# Patient Record
Sex: Female | Born: 1990 | Hispanic: Yes | Marital: Married | Smoking: Never smoker
Health system: Southern US, Community
[De-identification: ages and names within clinical notes are randomized; demographics above are authoritative.]

## PROBLEM LIST (undated history)

## (undated) DIAGNOSIS — R569 Unspecified convulsions: Secondary | ICD-10-CM

---

## 2015-10-24 ENCOUNTER — Encounter: Payer: Self-pay | Admitting: Emergency Medicine

## 2015-10-24 ENCOUNTER — Emergency Department: Payer: Self-pay

## 2015-10-24 ENCOUNTER — Observation Stay
Admission: EM | Admit: 2015-10-24 | Discharge: 2015-10-26 | Disposition: A | Discharge status: Home / Self Care | Payer: Self-pay | Attending: General Surgery | Admitting: General Surgery

## 2015-10-24 DIAGNOSIS — R1011 Right upper quadrant pain: Principal | ICD-10-CM | POA: Insufficient documentation

## 2015-10-24 DIAGNOSIS — R509 Fever, unspecified: Secondary | ICD-10-CM | POA: Insufficient documentation

## 2015-10-24 DIAGNOSIS — R52 Pain, unspecified: Secondary | ICD-10-CM | POA: Insufficient documentation

## 2015-10-24 DIAGNOSIS — R569 Unspecified convulsions: Secondary | ICD-10-CM | POA: Insufficient documentation

## 2015-10-24 DIAGNOSIS — Z791 Long term (current) use of non-steroidal anti-inflammatories (NSAID): Secondary | ICD-10-CM | POA: Insufficient documentation

## 2015-10-24 DIAGNOSIS — D1803 Hemangioma of intra-abdominal structures: Secondary | ICD-10-CM | POA: Insufficient documentation

## 2015-10-24 DIAGNOSIS — E876 Hypokalemia: Secondary | ICD-10-CM | POA: Insufficient documentation

## 2015-10-24 DIAGNOSIS — R112 Nausea with vomiting, unspecified: Secondary | ICD-10-CM | POA: Insufficient documentation

## 2015-10-24 DIAGNOSIS — R109 Unspecified abdominal pain: Secondary | ICD-10-CM | POA: Diagnosis present

## 2015-10-24 DIAGNOSIS — R1010 Upper abdominal pain, unspecified: Secondary | ICD-10-CM | POA: Insufficient documentation

## 2015-10-24 DIAGNOSIS — R51 Headache: Secondary | ICD-10-CM | POA: Insufficient documentation

## 2015-10-24 DIAGNOSIS — E871 Hypo-osmolality and hyponatremia: Secondary | ICD-10-CM | POA: Insufficient documentation

## 2015-10-24 HISTORY — DX: Unspecified convulsions: R56.9

## 2015-10-24 LAB — CBC
HCT: 32.2 % — ABNORMAL LOW (ref 35.0–47.0)
Hemoglobin: 10.9 g/dL — ABNORMAL LOW (ref 12.0–16.0)
MCH: 27.4 pg (ref 26.0–34.0)
MCHC: 33.7 g/dL (ref 32.0–36.0)
MCV: 81.4 fL (ref 80.0–100.0)
PLATELETS: 242 10*3/uL (ref 150–440)
RBC: 3.96 MIL/uL (ref 3.80–5.20)
RDW: 14.5 % (ref 11.5–14.5)
WBC: 14.5 10*3/uL — AB (ref 3.6–11.0)

## 2015-10-24 LAB — URINALYSIS COMPLETE WITH MICROSCOPIC (ARMC ONLY)
BILIRUBIN URINE: NEGATIVE
Bacteria, UA: NONE SEEN
GLUCOSE, UA: NEGATIVE mg/dL
Ketones, ur: NEGATIVE mg/dL
Nitrite: NEGATIVE
Protein, ur: 100 mg/dL — AB
SPECIFIC GRAVITY, URINE: 1.016 (ref 1.005–1.030)
pH: 5 (ref 5.0–8.0)

## 2015-10-24 LAB — COMPREHENSIVE METABOLIC PANEL
ALT: 26 U/L (ref 14–54)
AST: 26 U/L (ref 15–41)
Albumin: 2.7 g/dL — ABNORMAL LOW (ref 3.5–5.0)
Alkaline Phosphatase: 154 U/L — ABNORMAL HIGH (ref 38–126)
Anion gap: 7 (ref 5–15)
BUN: 13 mg/dL (ref 6–20)
CO2: 24 mmol/L (ref 22–32)
Calcium: 8.4 mg/dL — ABNORMAL LOW (ref 8.9–10.3)
Chloride: 107 mmol/L (ref 101–111)
Creatinine, Ser: 0.78 mg/dL (ref 0.44–1.00)
GFR calc Af Amer: >60 mL/min (ref >60)
GFR calc non Af Amer: >60 mL/min (ref >60)
Glucose, Bld: 94 mg/dL (ref 65–99)
Potassium: 3.1 mmol/L — ABNORMAL LOW (ref 3.5–5.1)
Sodium: 138 mmol/L (ref 135–145)
Total Bilirubin: 2.1 mg/dL — ABNORMAL HIGH (ref 0.3–1.2)
Total Protein: 6.6 g/dL (ref 6.5–8.1)

## 2015-10-24 LAB — LIPASE, BLOOD: Lipase: 21 U/L (ref 11–51)

## 2015-10-24 LAB — PREGNANCY, URINE: Preg Test, Ur: NEGATIVE

## 2015-10-24 MED ORDER — ONDANSETRON 8 MG PO TBDP: 4.0000 mg | ORAL_TABLET | Freq: Four times a day (QID) | ORAL | Status: DC | PRN

## 2015-10-24 MED ORDER — GI COCKTAIL ~~LOC~~
30.0000 mL | ORAL | Status: AC
  Administered 2015-10-24: 30 mL via ORAL
  Filled 2015-10-24: qty 30

## 2015-10-24 MED ORDER — PIPERACILLIN-TAZOBACTAM 3.375 G IVPB
3.3750 g | Freq: Three times a day (TID) | INTRAVENOUS | Status: DC
  Administered 2015-10-24 – 2015-10-26 (×6): 3.375 g via INTRAVENOUS
  Filled 2015-10-24 (×10): qty 50

## 2015-10-24 MED ORDER — KCL IN DEXTROSE-NACL 30-5-0.45 MEQ/L-%-% IV SOLN
INTRAVENOUS | Status: DC
  Administered 2015-10-24 – 2015-10-26 (×6): via INTRAVENOUS
  Filled 2015-10-24 (×10): qty 1000

## 2015-10-24 MED ORDER — LORAZEPAM 2 MG/ML IJ SOLN: 2.0000 mg | INTRAMUSCULAR | Status: DC | PRN

## 2015-10-24 MED ORDER — KETOROLAC TROMETHAMINE 15 MG/ML IJ SOLN
15.0000 mg | Freq: Four times a day (QID) | INTRAMUSCULAR | Status: DC | PRN
  Administered 2015-10-24: 15 mg via INTRAVENOUS
  Filled 2015-10-24: qty 1

## 2015-10-24 MED ORDER — ACETAMINOPHEN 325 MG PO TABS
650.0000 mg | ORAL_TABLET | Freq: Four times a day (QID) | ORAL | Status: DC | PRN
  Administered 2015-10-26: 650 mg via ORAL
  Filled 2015-10-24 (×2): qty 2

## 2015-10-24 MED ORDER — INFLUENZA VAC SPLIT QUAD 0.5 ML IM SUSY: 0.5000 mL | PREFILLED_SYRINGE | INTRAMUSCULAR | Status: DC

## 2015-10-24 MED ORDER — FAMOTIDINE 20 MG PO TABS
40.0000 mg | ORAL_TABLET | Freq: Once | ORAL | Status: AC
  Administered 2015-10-24: 40 mg via ORAL
  Filled 2015-10-24: qty 2

## 2015-10-24 MED ORDER — MORPHINE SULFATE (PF) 4 MG/ML IV SOLN
4.0000 mg | INTRAVENOUS | Status: DC | PRN
Start: 2015-10-24 — End: 2015-10-26

## 2015-10-24 MED ORDER — KETOROLAC TROMETHAMINE 15 MG/ML IJ SOLN: 15.0000 mg | Freq: Four times a day (QID) | INTRAMUSCULAR | Status: DC | PRN

## 2015-10-24 MED ORDER — DIPHENHYDRAMINE HCL 25 MG PO CAPS: 25.0000 mg | ORAL_CAPSULE | Freq: Four times a day (QID) | ORAL | Status: DC | PRN

## 2015-10-24 MED ORDER — DIPHENHYDRAMINE HCL 50 MG/ML IJ SOLN: 25.0000 mg | Freq: Four times a day (QID) | INTRAMUSCULAR | Status: DC | PRN

## 2015-10-24 MED ORDER — ONDANSETRON 8 MG PO TBDP
8.0000 mg | ORAL_TABLET | Freq: Once | ORAL | Status: AC
  Administered 2015-10-24: 8 mg via ORAL
  Filled 2015-10-24: qty 1

## 2015-10-24 MED ORDER — HYDRALAZINE HCL 20 MG/ML IJ SOLN: 10.0000 mg | INTRAMUSCULAR | Status: DC | PRN

## 2015-10-24 MED ORDER — PIPERACILLIN-TAZOBACTAM 3.375 G IVPB
3.3750 g | Freq: Once | INTRAVENOUS | Status: AC
  Administered 2015-10-24: 3.375 g via INTRAVENOUS
  Filled 2015-10-24: qty 50

## 2015-10-24 MED ORDER — ONDANSETRON HCL 4 MG/2ML IJ SOLN: 4.0000 mg | Freq: Four times a day (QID) | INTRAMUSCULAR | Status: DC | PRN

## 2015-10-24 MED ORDER — PANTOPRAZOLE SODIUM 40 MG IV SOLR
40.0000 mg | Freq: Every day | INTRAVENOUS | Status: DC
Start: 2015-10-24 — End: 2015-10-26
  Administered 2015-10-24 – 2015-10-25 (×2): 40 mg via INTRAVENOUS
  Filled 2015-10-24 (×2): qty 40

## 2015-10-24 NOTE — ED Notes (Addendum)
Interpreter present; pt c/o upper abd pain, fever and bone aches; intermittent vomiting; headache; pt has been feeling bad for a week; taking advil with no relief; pt ambulatory with steady gait; talking in complete coherent sentences; pt says nothing makes pain better, eating makes pain worse

## 2015-10-24 NOTE — ED Notes (Signed)
Interpreter request submitted. 

## 2015-10-24 NOTE — Consult Note (Signed)
Reason for Consult:  Chief Complaint  Patient presents with  . Abdominal Pain  . Fever  . Generalized Body Aches   Referring Physician: Dr. Isabella Bowens Misty Craig is an 25 y.o. female.  HPI: Patient is presenting to the ED with a chief complaint of abdominal pain, fever and generalized body aches. CT abdomen has revealed pyelonephritis versus cholecystitis. Patient is admitted to surgical service and hospitalist team is consulted for medical management including seizures Patient is resting comfortably during my examination. Her last episode of seizure was 3 years ago. Patient reports she had only one episode of seizures in her lifetime, following which she was seen by a specialist and had investigations done, subsequently patient was started on seizure medications but patient discontinued taking them after a while. She states that she is feeling well and not considering to restart taking any seizure medications at this time  Past Medical History  Diagnosis Date  . Seizure [facility])      History reviewed. No pertinent past surgical history.  History reviewed. No pertinent family history.  Social History:  reports that she has never smoked. She does not have any smokeless tobacco history on file. She reports that she does not drink alcohol or use illicit drugs.  Allergies: No Known Allergies  Medications: I have reviewed the patient's current medications.  Results for orders placed or performed during the hospital encounter of 10/24/15 (from the past 48 hour(s))  Lipase, blood     Status: None   Collection Time: 10/24/15  6:08 AM  Result Value Ref Range   Lipase 21 11 - 51 U/L  Comprehensive metabolic panel     Status: Abnormal   Collection Time: 10/24/15  6:08 AM  Result Value Ref Range   Sodium 138 135 - 145 mmol/L   Potassium 3.1 (L) 3.5 - 5.1 mmol/L   Chloride 107 101 - 111 mmol/L   CO2 24 22 - 32 mmol/L   Glucose, Bld 94 65 - 99 mg/dL   BUN 13 6 - 20 mg/dL   Creatinine,  Ser 0.78 0.44 - 1.00 mg/dL   Calcium 8.4 (L) 8.9 - 10.3 mg/dL   Total Protein 6.6 6.5 - 8.1 g/dL   Albumin 2.7 (L) 3.5 - 5.0 g/dL   AST 26 15 - 41 U/L   ALT 26 14 - 54 U/L   Alkaline Phosphatase 154 (H) 38 - 126 U/L   Total Bilirubin 2.1 (H) 0.3 - 1.2 mg/dL   GFR calc non Af Amer >60 >60 mL/min   GFR calc Af Amer >60 >60 mL/min    Comment: (NOTE) The eGFR has been calculated using the CKD EPI equation. This calculation has not been validated in all clinical situations. eGFR's persistently <60 mL/min signify possible Chronic Kidney Disease.    Anion gap 7 5 - 15  CBC     Status: Abnormal   Collection Time: 10/24/15  6:08 AM  Result Value Ref Range   WBC 14.5 (H) 3.6 - 11.0 K/uL   RBC 3.96 3.80 - 5.20 MIL/uL   Hemoglobin 10.9 (L) 12.0 - 16.0 g/dL   HCT 32.2 (L) 35.0 - 47.0 %   MCV 81.4 80.0 - 100.0 fL   MCH 27.4 26.0 - 34.0 pg   MCHC 33.7 32.0 - 36.0 g/dL   RDW 14.5 11.5 - 14.5 %   Platelets 242 150 - 440 K/uL  Urinalysis complete, with microscopic ([facility] only)     Status: Abnormal   Collection Time: 10/24/15  6:21 AM  Result Value Ref Range   Color, Urine AMBER (A) YELLOW   APPearance HAZY (A) CLEAR   Glucose, UA NEGATIVE NEGATIVE mg/dL   Bilirubin Urine NEGATIVE NEGATIVE   Ketones, ur NEGATIVE NEGATIVE mg/dL   Specific Gravity, Urine 1.016 1.005 - 1.030   Hgb urine dipstick 2+ (A) NEGATIVE   pH 5.0 5.0 - 8.0   Protein, ur 100 (A) NEGATIVE mg/dL   Nitrite NEGATIVE NEGATIVE   Leukocytes, UA TRACE (A) NEGATIVE   RBC / HPF TOO NUMEROUS TO COUNT 0 - 5 RBC/hpf   WBC, UA 6-30 0 - 5 WBC/hpf   Bacteria, UA NONE SEEN NONE SEEN   Squamous Epithelial / LPF 6-30 (A) NONE SEEN   WBC Clumps PRESENT    Mucous PRESENT   Pregnancy, urine     Status: None   Collection Time: 10/24/15  6:21 AM  Result Value Ref Range   Preg Test, Ur NEGATIVE NEGATIVE    Ct Abdomen Pelvis Wo Contrast  10/24/2015  CLINICAL DATA:  Upper abdominal pain radiating to bilateral flank. Fever. Intermittent  vomiting and headaches. EXAM: CT ABDOMEN AND PELVIS WITHOUT CONTRAST TECHNIQUE: Multidetector CT imaging of the abdomen and pelvis was performed following the standard protocol without IV contrast. COMPARISON:  None. FINDINGS: Lower chest:  Mild atelectasis at each lung base. Hepatobiliary: Gallbladder walls appear thickened/amorphous, suspicious for an acute cholecystitis. Liver is unremarkable. Pancreas: No mass or inflammatory process identified on this un-enhanced exam. Spleen: Within normal limits in size. Adrenals/Urinary Tract: Adrenal glands appear normal. Although limited by the lack of IV contrast, there do appear to be edematous changes within the right renal cortex and right perinephric edema suggesting pyelonephritis. No renal stones bilaterally. No ureteral or bladder calculi identified. Bladder is unremarkable. Stomach/Bowel: Bowel is normal in caliber. No bowel wall thickening or evidence of bowel wall inflammation. Visualized portion of the appendix appears normal. Vascular/Lymphatic: No pathologically enlarged lymph nodes. Numerous small lymph nodes within the retroperitoneum are likely reactive in nature. No evidence of abdominal aortic aneurysm. Reproductive: No mass or other significant abnormality. Other: None. Musculoskeletal:  No suspicious bone lesions identified. IMPRESSION: 1. Findings highly suspicious for an acute pyelonephritis of the right kidney. Characterization limited by the lack of IV contrast, but I suspect there is a fairly large amount of edema within the right renal cortex and there is associated perinephric edema/fluid stranding. This could be confirmed with a repeat abdomen CT with contrast if needed. Recommend correlation with urinalysis. 2. Gallbladder wall thickening, also seen on the abdomen ultrasound performed earlier same day. This is a nonspecific finding but may indicate concomitant cholecystitis. These results were called by telephone at the time of interpretation  on 10/24/2015 at 10:52 am to Dr. Carrie Mew , who verbally acknowledged these results. Electronically Signed   By: Franki Cabot M.D.   On: 10/24/2015 10:53   US Abdomen Limited Ruq  10/24/2015  CLINICAL DATA:  Epigastric/right upper quadrant pain for 1 week with vomit EXAM: US ABDOMEN LIMITED - RIGHT UPPER QUADRANT COMPARISON:  None. FINDINGS: Gallbladder: No gallstones are appreciable. The gallbladder wall appears edematous and thickened with minimal pericholecystic fluid. No sonographic Murphy sign noted by sonographer. Common bile duct: Diameter: 4 mm. There is no intrahepatic or extrahepatic biliary duct dilatation. Liver: There is an echogenic focus in the posterior segment of the right lobe of the liver measuring 1.3 x 0.8 x 1.2 cm. No other focal liver lesions are identified. There is an apparent Riedel's  lobe, an anatomic variant. Within normal limits in parenchymal echogenicity. IMPRESSION: Thickened and edematous gallbladder wall with small amount of pericholecystic fluid. Suspect acalculus cholecystitis. Potentially, nuclear medicine hepatobiliary imaging study could be helpful to assess for cystic duct patency. Echogenic focus in the posterior segment right lobe of the liver, probably a small hemangioma. As there are no prior studies to compare, a followup ultrasound in 1 year to assess for stability may be reasonable. Electronically Signed   By: Lowella Grip III M.D.   On: 10/24/2015 08:34    ROS:  CONSTITUTIONAL: Denies fevers, chills. Denies any fatigue, weakness.  EYES: Denies blurry vision, double vision, eye pain. EARS, NOSE, THROAT: Denies tinnitus, ear pain, hearing loss. RESPIRATORY: Denies cough, wheeze, shortness of breath.  CARDIOVASCULAR: Denies chest pain, palpitations, edema.  GASTROINTESTINAL: Denies nausea, vomiting, diarrhea, abdominal pain during my examination. Denies bright red blood per rectum. GENITOURINARY: Denies dysuria, hematuria. ENDOCRINE: Denies  nocturia or thyroid problems. HEMATOLOGIC AND LYMPHATIC: Denies easy bruising or bleeding. SKIN: Denies rash or lesion. MUSCULOSKELETAL: Denies pain in neck, back, shoulder, knees, hips or arthritic symptoms.  NEUROLOGIC: Denies paralysis, paresthesias.  PSYCHIATRIC: Denies anxiety or depressive symptoms. Blood pressure 107/65, pulse 91, temperature 99.3 F (37.4 C), temperature source Oral, resp. rate 16, weight 65.137 kg (143 lb 9.6 oz), last menstrual period 10/06/2015, SpO2 100 %.   PHYSICAL EXAMINATION:  GENERAL: Well-nourished, well-developed , currently in no acute distress.  HEAD: Normocephalic, atraumatic.  EYES: Pupils equal, round, and reactive to light. Extraocular muscles intact. No scleral icterus.  MOUTH: Moist mucosal membranes. Dentition intact. No abscess noted. EARS, NOSE, THROAT: Clear without exudates. No external lesions.  NECK: Supple. No thyromegaly. No nodules. No JVD.  PULMONARY: Clear to auscultation bilaterally without wheezes, rales, or rhonchi. No use of accessory muscles. Good respiratory effort. CHEST: Nontender to palpation.  CARDIOVASCULAR: S1, S2, regular rate and rhythm. No murmurs, rubs, or gallops.  GASTROINTESTINAL: Soft, nontender, nondistended. No masses. Positive bowel sounds. No hepatosplenomegaly. MUSCULOSKELETAL: No swelling, clubbing, edema. Range of motion full in all extremities. No flank tenderness NEUROLOGIC: Cranial nerves II-XII intact. No gross focal neurological deficits. Sensation intact. Reflexes intact. SKIN: No ulcerations, lesions, rash, cyanosis. Skin warm, dry. Turgor intact. PSYCHIATRIC: Mood, affect within normal limits. Patient awake, alert, oriented x 3. Insight and judgment intact.   Assessment/Plan:  #History of seizures Patient stopped taking her seizure medications which were started by neurologist [place] Currently refusing to start her on any seizure medications Agreeable to neurology consult, will consult  neurology Ativan 2 mg IV as needed for breakthrough seizures  #Hypokalemia Replacing potassium with IV fluids with KCl Follow-up a.m. labs BMP and magnesium   #Acute pyelonephritis Urine culture and sensitivities ordered if it is not done already Continue IV fluids and Zosyn Antiemetics as needed  #Abdominal pain probably secondary to acalculous cholecystitis versus acute pyelonephritis Per surgery  NPO,IVF,HIDA  Thank you for consulting hospitalist for medical management The plan of care was explained in detail with the patient with the help of Spanish interpreter Ms. Trudy. Patient is agreeable with the current assessment and plan  TOTAL TIME TAKING CARE OF THIS PATIENT: 45 minutes.  @[facility] @ Pager - [phone] 10/24/2015, 5:11 PM

## 2015-10-24 NOTE — ED Provider Notes (Signed)
[facility] Emergency Department Provider Note  ____________________________________________  Time seen: 7:05am  I have reviewed the triage vital signs and the nursing notes.   HISTORY  Chief Complaint Abdominal Pain; Fever; and Generalized Body Aches  History obtained with Spanish interpreter at bedside  HPI Misty Craig is a 25 y.o. female who complains of constant waxing and waning upper abdominal pain for the past week. It's worse with eating. No change with position. No fever she has nausea and vomiting as well. No diarrhea or constipation. No prior surgeries or other medical problems. Takes no medications.  Slightly relieved with ibuprofen that she's been taking over the past week but it always comes back   Past Medical History  Diagnosis Date  . Seizure [facility])      Patient Active Problem List   Diagnosis Date Noted  . Abdominal pain 10/24/2015     History reviewed. No pertinent past surgical history.   Current Outpatient Rx  Name  Route  Sig  Dispense  Refill  . ibuprofen (ADVIL,MOTRIN) 200 MG tablet   Oral   Take 400 mg by mouth every 6 (six) hours as needed for headache, mild pain or moderate pain.             Allergies Review of patient's allergies indicates no known allergies.   History reviewed. No pertinent family history.  Social History Social History  Substance Use Topics  . Smoking status: Never Smoker   . Smokeless tobacco: None  . Alcohol Use: No    Review of Systems  Constitutional:   No fever or chills. No weight changes Eyes:   No blurry vision or double vision.  ENT:   No sore throat. Cardiovascular:   No chest pain. Respiratory:   No dyspnea or cough. Gastrointestinal:   Positive upper abdominal pain with vomiting. No diarrhea.  No BRBPR or melena. Genitourinary:   Negative for dysuria, urinary retention, bloody urine, or difficulty urinating. Musculoskeletal:   Negative for back pain. No joint  swelling or pain. Skin:   Negative for rash. Neurological:   Negative for headaches, focal weakness or numbness. Psychiatric:  No anxiety or depression.   Endocrine:  No hot/cold intolerance, changes in energy, or sleep difficulty.  10-point ROS otherwise negative.  ____________________________________________   PHYSICAL EXAM:  VITAL SIGNS: ED Triage Vitals  Enc Vitals Group     BP 10/24/15 0541 114/77 mmHg     Pulse Rate 10/24/15 0541 112     Resp 10/24/15 0541 18     Temp 10/24/15 0541 98.5 F (36.9 C)     Temp Source 10/24/15 0541 Oral     SpO2 10/24/15 0541 99 %     Weight 10/24/15 0541 138 lb (62.596 kg)     Height --      Head Cir --      Peak Flow --      Pain Score 10/24/15 0538 8     Pain Loc --      Pain Edu? --      Excl. in [place]? --     Vital signs reviewed, nursing assessments reviewed.   Constitutional:   Alert and oriented. Well appearing and in no distress. Eyes:   No scleral icterus. No conjunctival pallor. PERRL. EOMI ENT   Head:   Normocephalic and atraumatic.   Nose:   No congestion/rhinnorhea. No septal hematoma   Mouth/Throat:   MMM, no pharyngeal erythema. No peritonsillar mass. No uvula shift.   Neck:  No stridor. No SubQ emphysema. No meningismus. Hematological/Lymphatic/Immunilogical:   No cervical lymphadenopathy. Cardiovascular:   Tachycardia heart rate 110. Normal and symmetric distal pulses are present in all extremities. No murmurs, rubs, or gallops. Respiratory:   Normal respiratory effort without tachypnea nor retractions. Breath sounds are clear and equal bilaterally. No wheezes/rales/rhonchi. Gastrointestinal:   Soft with epigastric and right upper quadrant tenderness. No distention. There is mild right CVA tenderness.  No rebound, rigidity, or guarding. Genitourinary:   deferred Musculoskeletal:   Nontender with normal range of motion in all extremities. No joint effusions.  No lower extremity tenderness.  No  edema. Neurologic:   Normal speech and language.  CN 2-10 normal. Motor grossly intact. No pronator drift.  Normal gait. No gross focal neurologic deficits are appreciated.  Skin:    Skin is warm, dry and intact. No rash noted.  No petechiae, purpura, or bullae. Psychiatric:   Mood and affect are normal. Speech and behavior are normal. Patient exhibits appropriate insight and judgment.  ____________________________________________    LABS (pertinent positives/negatives) (all labs ordered are listed, but only abnormal results are displayed) Labs Reviewed  COMPREHENSIVE METABOLIC PANEL - Abnormal; Notable for the following:    Potassium 3.1 (*)    Calcium 8.4 (*)    Albumin 2.7 (*)    Alkaline Phosphatase 154 (*)    Total Bilirubin 2.1 (*)    All other components within normal limits  CBC - Abnormal; Notable for the following:    WBC 14.5 (*)    Hemoglobin 10.9 (*)    HCT 32.2 (*)    All other components within normal limits  URINALYSIS COMPLETEWITH MICROSCOPIC ([facility] ONLY) - Abnormal; Notable for the following:    Color, Urine AMBER (*)    APPearance HAZY (*)    Hgb urine dipstick 2+ (*)    Protein, ur 100 (*)    Leukocytes, UA TRACE (*)    Squamous Epithelial / LPF 6-30 (*)    All other components within normal limits  LIPASE, BLOOD  PREGNANCY, URINE   ____________________________________________   EKG    ____________________________________________    RADIOLOGY  Ultrasound right upper quadrant reveals thickened edematous gallbladder with pericholecystic fluid suspicious for cholecystitis. CT abdomen and pelvis reveals edematous right kidney suspicious for pyelonephritis.  ____________________________________________   PROCEDURES   ____________________________________________   INITIAL IMPRESSION / ASSESSMENT AND PLAN / ED COURSE  Pertinent labs & imaging results that were available during my care of the patient were reviewed by me and considered in my  medical decision making (see chart for details).  Patient presents with upper abdominal pain. Initial history and exam suggestive of cholecystitis. Ultrasound reveals findings suggestive of cholecystitis as well. Case discussed with Dr. Adonis Huguenin of surgery who evaluated the patient and suggested CT scan due to the CVA tenderness on his exam.  I discussed the CT read with the radiologist who indicates that it does look like the patient has pyelonephritis. Dr. Adonis Huguenin plans to admit the patient for further evaluation and likely HIDA scan. At this point it appears she has cholecystitis or pyelonephritis or both. IV Zosyn for now, urine culture. Further care by inpatient team.     ____________________________________________   FINAL CLINICAL IMPRESSION(S) / ED DIAGNOSES  Final diagnoses:  Pain of upper abdomen  hematuria pyelonephritis    Carrie Mew, MD 10/24/15 1119

## 2015-10-24 NOTE — H&P (Signed)
Patient ID: Misty Craig, female   DOB: 29-May-1991, 25 y.o.   MRN: [id number]  CC: ABDOMINAL PAIN  HPI Misty Craig is a 25 y.o. female who presents to emergency department with a 7 day history of abdominal pain. Patient reports that last Sunday she developed midepigastric and right upper quadrant abdominal pain. Patient states the pain has been constant throughout. When questioned why she came to emergency purposes today she said she was tired of dealing with the pain and wanted further evaluation. She also reports having intermittent nausea and vomiting throughout the 7 days as well as some pain with urination. She denies any fevers, chills, chest pain, shortness of breath, diarrhea, constipation. She states she's never had pain like this before. She describes as a constant, sharp and crampy abdominal pain. She also reports a history of seizures or treated 3 years ago but cannot tell what she was treated with before where.  HPI  Past Medical History  Diagnosis Date  . Seizure [facility])     History reviewed. No pertinent past surgical history.  History reviewed. No pertinent family history.  Social History Social History  Substance Use Topics  . Smoking status: Never Smoker   . Smokeless tobacco: None  . Alcohol Use: No    No Known Allergies  Current Facility-Administered Medications  Medication Dose Route Frequency Provider Last Rate Last Dose  . piperacillin-tazobactam (ZOSYN) IVPB 3.375 g  3.375 g Intravenous Once Carrie Mew, MD       Current Outpatient Prescriptions  Medication Sig Dispense Refill  . ibuprofen (ADVIL,MOTRIN) 200 MG tablet Take 400 mg by mouth every 6 (six) hours as needed for headache, mild pain or moderate pain.        Review of Systems A multi-point review of systems was asked and was negative except for the findings documented in the history of present illness  Physical Exam Blood pressure 114/77, pulse 112, temperature 98.5 F (36.9 C),  temperature source Oral, resp. rate 18, weight 62.596 kg (138 lb), last menstrual period 10/06/2015, SpO2 99 %. CONSTITUTIONAL: Resting in bed in no acute distress. EYES: Pupils are equal, round, and reactive to light, Sclera are non-icteric. EARS, NOSE, MOUTH AND THROAT: The oropharynx is clear. The oral mucosa is pink and moist. Hearing is intact to voice. LYMPH NODES:  Lymph nodes in the neck are normal. RESPIRATORY:  Lungs are clear. There is normal respiratory effort, with equal breath sounds bilaterally, and without pathologic use of accessory muscles. CARDIOVASCULAR: Heart is regular without murmurs, gallops, or rubs. GI: The abdomen is soft, and nondistended. There is some tenderness in the midepigastric or right upper quadrant region. There is no Murphy sign. There is no evidence of peritonitis or tympany. She does have bilateral CVA tenderness on exam as well. There are no palpable masses. There is no hepatosplenomegaly. There are normal bowel sounds in all quadrants. GU: Rectal deferred.   MUSCULOSKELETAL: Normal muscle strength and tone. No cyanosis or edema.   SKIN: Turgor is good and there are no pathologic skin lesions or ulcers. NEUROLOGIC: Motor and sensation is grossly normal. Cranial nerves are grossly intact. PSYCH:  Oriented to person, place and time. Affect is normal.  Data Reviewed Labs and images reviewed. Labs do show a leukocytosis to 14.5 as well as a hyperbilirubinemia up to 2.1 with associated elevated alkaline phosphatase. She also has hypokalemia. Urinalysis does show evidence of blood in her urine without evidence of infection. Ultrasound shows a mildly thickened gallbladder with some  questionable pericholecystic fluid. There is no evidence of any cholelithiasis or ductal dilatation. Noncontrasted CT scan obtained due to her CVA tenderness does not show any hydronephrosis or obvious ureteral stones. I have personally reviewed the patient's imaging, laboratory findings  and medical records.    Assessment    25 year old female with abdominal pain. Possible acalculous cholecystitis.    Plan    Patient's complaints and symptoms are concerning for biliary versus urinary tract sources for her abdominal pain. Noncontrasted CT scan does not show any evidence of nephrolithiasis or cholelithiasis. Given her labs and ultrasound findings it is actually concerning for acalculous cholecystitis even though this is a rare occurrence. At this point we'll plan for admission, IV antibiotics, IV hydration and a HIDA scan as an inpatient. Discussed the HIDA scan would be the gold standard for the diagnosis of cholecystitis. We'll make plans for whether not she requires operative intervention after the HIDA scan is obtained. Given her history of seizures without augmentation of how she was treated we'll also obtain a medicine consult on admission.     Time spent with the patient was 90 minutes, with more than 50% of the time spent in face-to-face education, counseling and care coordination.     Clayburn Pert, MD FACS General Surgeon 10/24/2015, 10:52 AM

## 2015-10-25 ENCOUNTER — Observation Stay: Payer: MEDICAID

## 2015-10-25 DIAGNOSIS — R569 Unspecified convulsions: Secondary | ICD-10-CM | POA: Insufficient documentation

## 2015-10-25 DIAGNOSIS — R1011 Right upper quadrant pain: Secondary | ICD-10-CM

## 2015-10-25 LAB — COMPREHENSIVE METABOLIC PANEL
ALBUMIN: 2.2 g/dL — AB (ref 3.5–5.0)
ALT: 21 U/L (ref 14–54)
ANION GAP: 4 — AB (ref 5–15)
AST: 17 U/L (ref 15–41)
Alkaline Phosphatase: 111 U/L (ref 38–126)
BILIRUBIN TOTAL: 2.3 mg/dL — AB (ref 0.3–1.2)
BUN: 10 mg/dL (ref 6–20)
CO2: 25 mmol/L (ref 22–32)
Calcium: 7.8 mg/dL — ABNORMAL LOW (ref 8.9–10.3)
Chloride: 109 mmol/L (ref 101–111)
Creatinine, Ser: 0.77 mg/dL (ref 0.44–1.00)
GFR calc non Af Amer: >60 mL/min (ref >60)
GLUCOSE: 99 mg/dL (ref 65–99)
POTASSIUM: 3.3 mmol/L — AB (ref 3.5–5.1)
SODIUM: 138 mmol/L (ref 135–145)
TOTAL PROTEIN: 5.7 g/dL — AB (ref 6.5–8.1)

## 2015-10-25 LAB — CBC
HCT: 29.9 % — ABNORMAL LOW (ref 35.0–47.0)
Hemoglobin: 10.1 g/dL — ABNORMAL LOW (ref 12.0–16.0)
MCH: 28 pg (ref 26.0–34.0)
MCHC: 33.8 g/dL (ref 32.0–36.0)
MCV: 82.9 fL (ref 80.0–100.0)
PLATELETS: 229 10*3/uL (ref 150–440)
RBC: 3.61 MIL/uL — AB (ref 3.80–5.20)
RDW: 14.6 % — ABNORMAL HIGH (ref 11.5–14.5)
WBC: 9.2 10*3/uL (ref 3.6–11.0)

## 2015-10-25 LAB — PROTIME-INR
INR: 1.31
PROTHROMBIN TIME: 16.4 s — AB (ref 11.4–15.0)

## 2015-10-25 LAB — MAGNESIUM: Magnesium: 1.7 mg/dL (ref 1.7–2.4)

## 2015-10-25 LAB — PHOSPHORUS: PHOSPHORUS: 3 mg/dL (ref 2.5–4.6)

## 2015-10-25 LAB — APTT: APTT: 29 s (ref 24–36)

## 2015-10-25 NOTE — Progress Notes (Signed)
Notified by radiology and nuclear medicine that the patient's nothing by mouth status had precluded the ability to obtain good test results and that the patient needed to be on a regular diet and nothing by mouth after midnight. The patient's test was rescheduled for tomorrow but in the meantime the patient will continue on IV antibiotics for presumed pyelonephritis.

## 2015-10-25 NOTE — Consult Note (Addendum)
Reason for Consult:Seizures Referring Physician: Adonis Huguenin  CC: Abdominal pain  HPI: Misty Craig is an 25 y.o. female who reports a history of seizures when she lived in [place].  Reports that the seizures were brought on by the cod air from the refrigerator.  Reports only having one seizure in the past.  Was on medication for about 6 months but when she left to come to the [place] she decided that she no longer wanted to be on the medication.  That was three years ago.  She has been off medication for the entire three years and had not had any recurrent seizures.   Patient Spanish-speaking.  All history obtained through interpreter.    Past Medical History  Diagnosis Date  . Seizure [facility])     History reviewed. No pertinent past surgical history.  Family history: No family history of seizures.  Mother with gastritis.  Father alive and well.    Social History:  reports that she has never smoked. She does not have any smokeless tobacco history on file. She reports that she does not drink alcohol or use illicit drugs.  No Known Allergies  Medications:  I have reviewed the patient's current medications. Prior to Admission:  Prescriptions prior to admission  Medication Sig Dispense Refill Last Dose  . ibuprofen (ADVIL,MOTRIN) 200 MG tablet Take 400 mg by mouth every 6 (six) hours as needed for headache, mild pain or moderate pain.    10/23/2015 at Unknown time   Scheduled: . Influenza vac split quadrivalent PF  0.5 mL Intramuscular Tomorrow-1000  . pantoprazole (PROTONIX) IV  40 mg Intravenous QHS  . piperacillin-tazobactam (ZOSYN)  IV  3.375 g Intravenous 3 times per day    ROS: History obtained from the patient  General ROS: negative for - chills, fatigue, fever, night sweats, weight gain or weight loss Psychological ROS: negative for - behavioral disorder, hallucinations, memory difficulties, mood swings or suicidal ideation Ophthalmic ROS: negative for - blurry vision, double  vision, eye pain or loss of vision ENT ROS: negative for - epistaxis, nasal discharge, oral lesions, sore throat, tinnitus or vertigo Allergy and Immunology ROS: negative for - hives or itchy/watery eyes Hematological and Lymphatic ROS: negative for - bleeding problems, bruising or swollen lymph nodes Endocrine ROS: negative for - galactorrhea, hair pattern changes, polydipsia/polyuria or temperature intolerance Respiratory ROS: negative for - cough, hemoptysis, shortness of breath or wheezing Cardiovascular ROS: negative for - chest pain, dyspnea on exertion, edema or irregular heartbeat Gastrointestinal ROS: negative for - abdominal pain, diarrhea, hematemesis, nausea/vomiting or stool incontinence Genito-Urinary ROS: negative for - dysuria, hematuria, incontinence or urinary frequency/urgency Musculoskeletal ROS: negative for - joint swelling or muscular weakness Neurological ROS: as noted in HPI Dermatological ROS: negative for rash and skin lesion changes  Physical Examination: Blood pressure 99/55, pulse 43, temperature 100 F (37.8 C), temperature source Oral, resp. rate 16, weight 65.137 kg (143 lb 9.6 oz), last menstrual period 10/06/2015, SpO2 95 %.  HEENT-  Normocephalic, no lesions, without obvious abnormality.  Normal external eye and conjunctiva.  Normal TM's bilaterally.  Normal auditory canals and external ears. Normal external nose, mucus membranes and septum.  Normal pharynx. Cardiovascular- S1, S2 normal, pulses palpable throughout   Lungs- chest clear, no wheezing, rales, normal symmetric air entry Abdomen- soft, non-tender; bowel sounds normal; no masses,  no organomegaly Extremities- no edema Lymph-no adenopathy palpable Musculoskeletal-no joint tenderness, deformity or swelling Skin-warm and dry, no hyperpigmentation, vitiligo, or suspicious lesions  Neurological Examination  Mental Status: Alert, oriented, thought content appropriate.  Speech fluent without evidence  of aphasia.  Able to follow 3 step commands without difficulty. Cranial Nerves: II: Discs flat bilaterally; Visual fields grossly normal, pupils equal, round, reactive to light and accommodation III,IV, VI: ptosis not present, extra-ocular motions intact bilaterally V,VII: smile symmetric, facial light touch sensation normal bilaterally VIII: hearing normal bilaterally IX,X: gag reflex present XI: bilateral shoulder shrug XII: midline tongue extension Motor: Right : Upper extremity   5/5    Left:     Upper extremity   5/5  Lower extremity   5/5     Lower extremity   5/5 Tone and bulk:normal tone throughout; no atrophy noted Sensory: Pinprick and light touch intact throughout, bilaterally Deep Tendon Reflexes: 2+ and symmetric throughout Plantars: Right: downgoing   Left: downgoing Cerebellar: Normal finger-to-nose and normal heel-to-shin testing bilaterally Gait: not tested due to pain   Laboratory Studies:   Basic Metabolic Panel:  Recent Labs Lab 10/24/15 0608 10/25/15 0444  NA 138 138  K 3.1* 3.3*  CL 107 109  CO2 24 25  GLUCOSE 94 99  BUN 13 10  CREATININE 0.78 0.77  CALCIUM 8.4* 7.8*  MG  --  1.7  PHOS  --  3.0    Liver Function Tests:  Recent Labs Lab 10/24/15 0608 10/25/15 0444  AST 26 17  ALT 26 21  ALKPHOS 154* 111  BILITOT 2.1* 2.3*  PROT 6.6 5.7*  ALBUMIN 2.7* 2.2*    Recent Labs Lab 10/24/15 0608  LIPASE 21   No results for input(s): AMMONIA in the last 168 hours.  CBC:  Recent Labs Lab 10/24/15 0608 10/25/15 0444  WBC 14.5* 9.2  HGB 10.9* 10.1*  HCT 32.2* 29.9*  MCV 81.4 82.9  PLT 242 229    Cardiac Enzymes: No results for input(s): CKTOTAL, CKMB, CKMBINDEX, TROPONINI in the last 168 hours.  BNP: Invalid input(s): POCBNP  CBG: No results for input(s): GLUCAP in the last 168 hours.  Microbiology: Results for orders placed or performed during the hospital encounter of 10/24/15  Urine culture     Status: None (Preliminary  result)   Collection Time: 10/24/15  6:21 AM  Result Value Ref Range Status   Specimen Description URINE, CLEAN CATCH  Final   Special Requests NONE  Final   Culture NO GROWTH < 24 HOURS  Final   Report Status PENDING  Incomplete    Coagulation Studies:  Recent Labs  10/25/15 0444  LABPROT 16.4*  INR 1.31    Urinalysis:  Recent Labs Lab 10/24/15 0621  COLORURINE AMBER*  LABSPEC 1.016  PHURINE 5.0  GLUCOSEU NEGATIVE  HGBUR 2+*  BILIRUBINUR NEGATIVE  KETONESUR NEGATIVE  PROTEINUR 100*  NITRITE NEGATIVE  LEUKOCYTESUR TRACE*    Lipid Panel:  No results found for: CHOL, TRIG, HDL, CHOLHDL, VLDL, LDLCALC  HgbA1C: No results found for: HGBA1C  Urine Drug Screen:  No results found for: LABOPIA, COCAINSCRNUR, LABBENZ, AMPHETMU, THCU, LABBARB  Alcohol Level: No results for input(s): ETH in the last 168 hours.   Imaging: Ct Abdomen Pelvis Wo Contrast  10/24/2015  CLINICAL DATA:  Upper abdominal pain radiating to bilateral flank. Fever. Intermittent vomiting and headaches. EXAM: CT ABDOMEN AND PELVIS WITHOUT CONTRAST TECHNIQUE: Multidetector CT imaging of the abdomen and pelvis was performed following the standard protocol without IV contrast. COMPARISON:  None. FINDINGS: Lower chest:  Mild atelectasis at each lung base. Hepatobiliary: Gallbladder walls appear thickened/amorphous, suspicious for an acute cholecystitis. Liver is  unremarkable. Pancreas: No mass or inflammatory process identified on this un-enhanced exam. Spleen: Within normal limits in size. Adrenals/Urinary Tract: Adrenal glands appear normal. Although limited by the lack of IV contrast, there do appear to be edematous changes within the right renal cortex and right perinephric edema suggesting pyelonephritis. No renal stones bilaterally. No ureteral or bladder calculi identified. Bladder is unremarkable. Stomach/Bowel: Bowel is normal in caliber. No bowel wall thickening or evidence of bowel wall inflammation.  Visualized portion of the appendix appears normal. Vascular/Lymphatic: No pathologically enlarged lymph nodes. Numerous small lymph nodes within the retroperitoneum are likely reactive in nature. No evidence of abdominal aortic aneurysm. Reproductive: No mass or other significant abnormality. Other: None. Musculoskeletal:  No suspicious bone lesions identified. IMPRESSION: 1. Findings highly suspicious for an acute pyelonephritis of the right kidney. Characterization limited by the lack of IV contrast, but I suspect there is a fairly large amount of edema within the right renal cortex and there is associated perinephric edema/fluid stranding. This could be confirmed with a repeat abdomen CT with contrast if needed. Recommend correlation with urinalysis. 2. Gallbladder wall thickening, also seen on the abdomen ultrasound performed earlier same day. This is a nonspecific finding but may indicate concomitant cholecystitis. These results were called by telephone at the time of interpretation on 10/24/2015 at 10:52 am to Dr. Carrie Mew , who verbally acknowledged these results. Electronically Signed   By: Franki Cabot M.D.   On: 10/24/2015 10:53   US Abdomen Limited Ruq  10/24/2015  CLINICAL DATA:  Epigastric/right upper quadrant pain for 1 week with vomit EXAM: US ABDOMEN LIMITED - RIGHT UPPER QUADRANT COMPARISON:  None. FINDINGS: Gallbladder: No gallstones are appreciable. The gallbladder wall appears edematous and thickened with minimal pericholecystic fluid. No sonographic Murphy sign noted by sonographer. Common bile duct: Diameter: 4 mm. There is no intrahepatic or extrahepatic biliary duct dilatation. Liver: There is an echogenic focus in the posterior segment of the right lobe of the liver measuring 1.3 x 0.8 x 1.2 cm. No other focal liver lesions are identified. There is an apparent Riedel's lobe, an anatomic variant. Within normal limits in parenchymal echogenicity. IMPRESSION: Thickened and edematous  gallbladder wall with small amount of pericholecystic fluid. Suspect acalculus cholecystitis. Potentially, nuclear medicine hepatobiliary imaging study could be helpful to assess for cystic duct patency. Echogenic focus in the posterior segment right lobe of the liver, probably a small hemangioma. As there are no prior studies to compare, a followup ultrasound in 1 year to assess for stability may be reasonable. Electronically Signed   By: Lowella Grip III M.D.   On: 10/24/2015 08:34     Assessment/Plan: 25 year old female with a remote history of seizure.  Has been seizure free and off medications for the past three years.  Neurological examination is nonfocal.  Anticonvulsant therapy not indicated at this time.   No further neurologic intervention is recommended at this time.  If further questions arise, please call or page at that time.  Thank you for allowing neurology to participate in the care of this patient.   Alexis Goodell, MD Neurology [phone] 10/25/2015, 12:07 PM

## 2015-10-25 NOTE — Care Management (Signed)
Patient admitted from home with abdominal pain.  Spanish speaking only.  Medical interpreter at bedside for assessment.  Patient states that she lives at home with her husband.  She is unemployed and does not have insurance.  Patient was provided Spanish applications for [facility] and Medication Management.  RNCM following for medications at time of discharge

## 2015-10-25 NOTE — Progress Notes (Signed)
[place] [facility] [place] NAME: Misty Craig    MR#:  [id number]  DATE OF BIRTH:  [birth year]  SUBJECTIVE:  Patient admitted yesterday with abdominal pain concern for pyelonephritis versus cholecystitis admitted to the general surgery team medical consult obtained  Patient sitting up in bed husband at bedside denies pain this morning  REVIEW OF SYSTEMS:  CONSTITUTIONAL: No fever, fatigue or weakness.  EYES: No blurred or double vision.  EARS, NOSE, AND THROAT: No tinnitus or ear pain.  RESPIRATORY: No cough, shortness of breath, wheezing or hemoptysis.  CARDIOVASCULAR: No chest pain, orthopnea, edema.  GASTROINTESTINAL: No nausea, vomiting, diarrhea or abdominal pain.  GENITOURINARY: No dysuria, hematuria.  ENDOCRINE: No polyuria, nocturia,  HEMATOLOGY: No anemia, easy bruising or bleeding SKIN: No rash or lesion. MUSCULOSKELETAL: No joint pain or arthritis.   NEUROLOGIC: No tingling, numbness, weakness.  PSYCHIATRY: No anxiety or depression.   DRUG ALLERGIES:  No Known Allergies  VITALS:  Blood pressure 96/68, pulse 70, temperature 99 F (37.2 C), temperature source Oral, resp. rate 16, weight 65.137 kg (143 lb 9.6 oz), last menstrual period 10/06/2015, SpO2 97 %.  PHYSICAL EXAMINATION:  VITAL SIGNS: Filed Vitals:   10/25/15 0516 10/25/15 1220  BP: 99/55 96/68  Pulse: 43 70  Temp: 100 F (37.8 C) 99 F (37.2 C)  Resp: 16    GENERAL:25 y.o.female currently in no acute distress.  HEAD: Normocephalic, atraumatic.  EYES: Pupils equal, round, reactive to light. Extraocular muscles intact. No scleral icterus.  MOUTH: Moist mucosal membrane. Dentition intact. No abscess noted.  EAR, NOSE, THROAT: Clear without exudates. No external lesions.  NECK: Supple. No thyromegaly. No nodules. No JVD.  PULMONARY: Clear to ascultation, without wheeze rails or rhonci. No use of accessory muscles, Good respiratory effort. good air entry  bilaterally CHEST: Nontender to palpation.  CARDIOVASCULAR: S1 and S2. Regular rate and rhythm. No murmurs, rubs, or gallops. No edema. Pedal pulses 2+ bilaterally.  GASTROINTESTINAL: Soft, nontender, nondistended. No masses. Positive bowel sounds. No hepatosplenomegaly.  MUSCULOSKELETAL: No swelling, clubbing, or edema. Range of motion full in all extremities.  NEUROLOGIC: Cranial nerves II through XII are intact. No gross focal neurological deficits. Sensation intact. Reflexes intact.  SKIN: No ulceration, lesions, rashes, or cyanosis. Skin warm and dry. Turgor intact.  PSYCHIATRIC: Mood, affect within normal limits. The patient is awake, alert and oriented x 3. Insight, judgment intact.      LABORATORY PANEL:   CBC  Recent Labs Lab 10/25/15 0444  WBC 9.2  HGB 10.1*  HCT 29.9*  PLT 229   ------------------------------------------------------------------------------------------------------------------  Chemistries   Recent Labs Lab 10/25/15 0444  NA 138  K 3.3*  CL 109  CO2 25  GLUCOSE 99  BUN 10  CREATININE 0.77  CALCIUM 7.8*  MG 1.7  AST 17  ALT 21  ALKPHOS 111  BILITOT 2.3*   ------------------------------------------------------------------------------------------------------------------  Cardiac Enzymes No results for input(s): TROPONINI in the last 168 hours. ------------------------------------------------------------------------------------------------------------------  RADIOLOGY:  Ct Abdomen Pelvis Wo Contrast  10/24/2015  CLINICAL DATA:  Upper abdominal pain radiating to bilateral flank. Fever. Intermittent vomiting and headaches. EXAM: CT ABDOMEN AND PELVIS WITHOUT CONTRAST TECHNIQUE: Multidetector CT imaging of the abdomen and pelvis was performed following the standard protocol without IV contrast. COMPARISON:  None. FINDINGS: Lower chest:  Mild atelectasis at each lung base. Hepatobiliary: Gallbladder walls appear thickened/amorphous, suspicious  for an acute cholecystitis. Liver is unremarkable. Pancreas: No mass or inflammatory process identified on this  un-enhanced exam. Spleen: Within normal limits in size. Adrenals/Urinary Tract: Adrenal glands appear normal. Although limited by the lack of IV contrast, there do appear to be edematous changes within the right renal cortex and right perinephric edema suggesting pyelonephritis. No renal stones bilaterally. No ureteral or bladder calculi identified. Bladder is unremarkable. Stomach/Bowel: Bowel is normal in caliber. No bowel wall thickening or evidence of bowel wall inflammation. Visualized portion of the appendix appears normal. Vascular/Lymphatic: No pathologically enlarged lymph nodes. Numerous small lymph nodes within the retroperitoneum are likely reactive in nature. No evidence of abdominal aortic aneurysm. Reproductive: No mass or other significant abnormality. Other: None. Musculoskeletal:  No suspicious bone lesions identified. IMPRESSION: 1. Findings highly suspicious for an acute pyelonephritis of the right kidney. Characterization limited by the lack of IV contrast, but I suspect there is a fairly large amount of edema within the right renal cortex and there is associated perinephric edema/fluid stranding. This could be confirmed with a repeat abdomen CT with contrast if needed. Recommend correlation with urinalysis. 2. Gallbladder wall thickening, also seen on the abdomen ultrasound performed earlier same day. This is a nonspecific finding but may indicate concomitant cholecystitis. These results were called by telephone at the time of interpretation on 10/24/2015 at 10:52 am to Dr. Carrie Mew , who verbally acknowledged these results. Electronically Signed   By: Franki Cabot M.D.   On: 10/24/2015 10:53   US Abdomen Limited Ruq  10/24/2015  CLINICAL DATA:  Epigastric/right upper quadrant pain for 1 week with vomit EXAM: US ABDOMEN LIMITED - RIGHT UPPER QUADRANT COMPARISON:  None.  FINDINGS: Gallbladder: No gallstones are appreciable. The gallbladder wall appears edematous and thickened with minimal pericholecystic fluid. No sonographic Murphy sign noted by sonographer. Common bile duct: Diameter: 4 mm. There is no intrahepatic or extrahepatic biliary duct dilatation. Liver: There is an echogenic focus in the posterior segment of the right lobe of the liver measuring 1.3 x 0.8 x 1.2 cm. No other focal liver lesions are identified. There is an apparent Riedel's lobe, an anatomic variant. Within normal limits in parenchymal echogenicity. IMPRESSION: Thickened and edematous gallbladder wall with small amount of pericholecystic fluid. Suspect acalculus cholecystitis. Potentially, nuclear medicine hepatobiliary imaging study could be helpful to assess for cystic duct patency. Echogenic focus in the posterior segment right lobe of the liver, probably a small hemangioma. As there are no prior studies to compare, a followup ultrasound in 1 year to assess for stability may be reasonable. Electronically Signed   By: Lowella Grip III M.D.   On: 10/24/2015 08:34    EKG:  No orders found for this or any previous visit.  ASSESSMENT AND PLAN:   25 year old Hispanic female admitted 10/24/15 for abdominal pain with concern pyelonephritis versus cholecystitis  1. Pyelonephritis: Symptoms improved, currently on Zosyn day 2 urine culture pending no growth at this time 2. Possible cholecystitis: Care per primary, HIDA scan pending-if no cholecystitis can decrease antibiotic coverage 3. Hypokalemia: Improved but still low continue IV, replace oral, when diet advanced 4. Seizure disorder: On no medications neurology input appreciated     All the records are reviewed and case discussed with Care Management/Social Workerr. Management plans discussed with the patient, family and they are in agreement.  CODE STATUS: Full  TOTAL TIME TAKING CARE OF THIS PATIENT: 26 minutes.   POSSIBLE D/C IN  1 DAYS, DEPENDING ON CLINICAL CONDITION.   Melodi Happel,  Karenann Cai.D on 10/25/2015 at 2:47 PM  Between 7am to 6pm -  Pager - [phone]  After 6pm: House Pager: - [phone]  [facility]  Office  [phone]  CC: Primary care physician; No primary care provider on file.

## 2015-10-25 NOTE — Progress Notes (Signed)
Patient feels better this afternoon and is tolerating a regular diet. No nausea or vomiting no fevers or chills and her pain has resolved.  Vital signs are stable she is afebrile No acute distress No scleral icterus no jaundice Abdomen is soft and minimally tender in the epigastrium negative Murphy sign nontender calves  Labs are reviewed HIDA scan could not be performed as previously noted. It is scheduled for tomorrow morning. Patient is currently being treated for pyelonephritis with potential for acalculous cholecystitis minimal at this point. We'll likely be able to go home tomorrow unless HIDA scan is positive.

## 2015-10-26 ENCOUNTER — Observation Stay: Payer: Self-pay

## 2015-10-26 DIAGNOSIS — R569 Unspecified convulsions: Secondary | ICD-10-CM

## 2015-10-26 LAB — CBC WITH DIFFERENTIAL/PLATELET
BASOS ABS: 0 10*3/uL (ref 0–0.1)
Basophils Relative: 0 %
EOS ABS: 0.2 10*3/uL (ref 0–0.7)
Eosinophils Relative: 2 %
HCT: 29.8 % — ABNORMAL LOW (ref 35.0–47.0)
HEMOGLOBIN: 10.1 g/dL — AB (ref 12.0–16.0)
LYMPHS ABS: 2.1 10*3/uL (ref 1.0–3.6)
LYMPHS PCT: 17 %
MCH: 27.4 pg (ref 26.0–34.0)
MCHC: 33.9 g/dL (ref 32.0–36.0)
MCV: 80.9 fL (ref 80.0–100.0)
MONO ABS: 1.9 10*3/uL — AB (ref 0.2–0.9)
Monocytes Relative: 16 %
NEUTROS PCT: 65 %
Neutro Abs: 7.9 10*3/uL — ABNORMAL HIGH (ref 1.4–6.5)
PLATELETS: 255 10*3/uL (ref 150–440)
RBC: 3.69 MIL/uL — ABNORMAL LOW (ref 3.80–5.20)
RDW: 14.5 % (ref 11.5–14.5)
WBC: 12.1 10*3/uL — ABNORMAL HIGH (ref 3.6–11.0)

## 2015-10-26 LAB — COMPREHENSIVE METABOLIC PANEL
ALBUMIN: 2.1 g/dL — AB (ref 3.5–5.0)
ALK PHOS: 108 U/L (ref 38–126)
ALT: 21 U/L (ref 14–54)
ANION GAP: 6 (ref 5–15)
AST: 20 U/L (ref 15–41)
BUN: 8 mg/dL (ref 6–20)
CALCIUM: 7.5 mg/dL — AB (ref 8.9–10.3)
CHLORIDE: 101 mmol/L (ref 101–111)
CO2: 23 mmol/L (ref 22–32)
Creatinine, Ser: 0.73 mg/dL (ref 0.44–1.00)
GFR calc non Af Amer: >60 mL/min (ref >60)
GLUCOSE: 108 mg/dL — AB (ref 65–99)
Potassium: 3.6 mmol/L (ref 3.5–5.1)
SODIUM: 130 mmol/L — AB (ref 135–145)
Total Bilirubin: 1.9 mg/dL — ABNORMAL HIGH (ref 0.3–1.2)
Total Protein: 5.7 g/dL — ABNORMAL LOW (ref 6.5–8.1)

## 2015-10-26 LAB — URINE CULTURE

## 2015-10-26 MED ORDER — POLYETHYLENE GLYCOL 3350 17 G PO PACK: 17.0000 g | PACK | Freq: Every day | ORAL | Status: DC | PRN

## 2015-10-26 MED ORDER — TECHNETIUM TC 99M MEBROFENIN IV KIT
4.9700 | PACK | Freq: Once | INTRAVENOUS | Status: AC | PRN
  Administered 2015-10-26: 4.97 via INTRAVENOUS

## 2015-10-26 MED ORDER — HYDROCODONE-ACETAMINOPHEN 5-300 MG PO TABS: 1.0000 | ORAL_TABLET | ORAL | Status: AC | PRN

## 2015-10-26 MED ORDER — CEPHALEXIN 500 MG PO CAPS: 500.0000 mg | ORAL_CAPSULE | Freq: Four times a day (QID) | ORAL | Status: AC

## 2015-10-26 NOTE — Progress Notes (Signed)
Awaiting on HIDA scan that was ordered on Sunday. We'll see later today after results.

## 2015-10-26 NOTE — Progress Notes (Signed)
[facility] NAME: Misty Craig    MR#:  [id number]  DATE OF BIRTH:  26-Oct-1990  SUBJECTIVE:  No pain at this time, no complaints  REVIEW OF SYSTEMS:  CONSTITUTIONAL: No fever, fatigue or weakness.  EYES: No blurred or double vision.  EARS, NOSE, AND THROAT: No tinnitus or ear pain.  RESPIRATORY: No cough, shortness of breath, wheezing or hemoptysis.  CARDIOVASCULAR: No chest pain, orthopnea, edema.  GASTROINTESTINAL: No nausea, vomiting, diarrhea or abdominal pain.  GENITOURINARY: No dysuria, hematuria.  ENDOCRINE: No polyuria, nocturia,  HEMATOLOGY: No anemia, easy bruising or bleeding SKIN: No rash or lesion. MUSCULOSKELETAL: No joint pain or arthritis.   NEUROLOGIC: No tingling, numbness, weakness.  PSYCHIATRY: No anxiety or depression.   DRUG ALLERGIES:  No Known Allergies  VITALS:  Blood pressure 106/60, pulse 72, temperature 98.8 F (37.1 C), temperature source Oral, resp. rate 16, weight 65.137 kg (143 lb 9.6 oz), last menstrual period 10/06/2015, SpO2 98 %.  PHYSICAL EXAMINATION:  VITAL SIGNS: Filed Vitals:   10/25/15 2106 10/26/15 0504  BP: 102/66 106/60  Pulse: 86 72  Temp: 98.7 F (37.1 C) 98.8 F (37.1 C)  Resp: 16    GENERAL:25 y.o.female currently in no acute distress.  HEAD: Normocephalic, atraumatic.  EYES: Pupils equal, round, reactive to light. Extraocular muscles intact. No scleral icterus.  MOUTH: Moist mucosal membrane. Dentition intact. No abscess noted.  EAR, NOSE, THROAT: Clear without exudates. No external lesions.  NECK: Supple. No thyromegaly. No nodules. No JVD.  PULMONARY: Clear to ascultation, without wheeze rails or rhonci. No use of accessory muscles, Good respiratory effort. good air entry bilaterally CHEST: Nontender to palpation.  CARDIOVASCULAR: S1 and S2. Regular rate and rhythm. No murmurs, rubs, or gallops. No edema. Pedal pulses 2+ bilaterally.   GASTROINTESTINAL: Soft, nontender, nondistended. No masses. Positive bowel sounds. No hepatosplenomegaly.  MUSCULOSKELETAL: No swelling, clubbing, or edema. Range of motion full in all extremities.  NEUROLOGIC: Cranial nerves II through XII are intact. No gross focal neurological deficits. Sensation intact. Reflexes intact.  SKIN: No ulceration, lesions, rashes, or cyanosis. Skin warm and dry. Turgor intact.  PSYCHIATRIC: Mood, affect within normal limits. The patient is awake, alert and oriented x 3. Insight, judgment intact.      LABORATORY PANEL:   CBC  Recent Labs Lab 10/26/15 0557  WBC 12.1*  HGB 10.1*  HCT 29.8*  PLT 255   ------------------------------------------------------------------------------------------------------------------  Chemistries   Recent Labs Lab 10/25/15 0444 10/26/15 0557  NA 138 130*  K 3.3* 3.6  CL 109 101  CO2 25 23  GLUCOSE 99 108*  BUN 10 8  CREATININE 0.77 0.73  CALCIUM 7.8* 7.5*  MG 1.7  --   AST 17 20  ALT 21 21  ALKPHOS 111 108  BILITOT 2.3* 1.9*   ------------------------------------------------------------------------------------------------------------------  Cardiac Enzymes No results for input(s): TROPONINI in the last 168 hours. ------------------------------------------------------------------------------------------------------------------  RADIOLOGY:  No results found.  EKG:  No orders found for this or any previous visit.  ASSESSMENT AND PLAN:   25 year old Hispanic female admitted 10/24/15 for abdominal pain with concern pyelonephritis versus cholecystitis  1. Pyelonephritis: Symptoms improved, currently on Zosyn day 3 urine culture pending no growth at this time. As long as HIDA negative no indication for this product coverage would change antibiotics to Ceftin 2. Hyponatremia: Change IV fluids to normal saline if remains nothing by mouth 2. Possible cholecystitis: Care per primary, HIDA scan pending-if no  cholecystitis  can decrease antibiotic coverage 3. Hypokalemia: Improved but still low continue IV, replace oral, when diet advanced 4. Seizure disorder: On no medications neurology input appreciated     All the records are reviewed and case discussed with Care Management/Social Workerr. Management plans discussed with the patient, family and they are in agreement.  CODE STATUS: Full  TOTAL TIME TAKING CARE OF THIS PATIENT: 27 minutes.   POSSIBLE D/C IN 1 DAYS, DEPENDING ON CLINICAL CONDITION.   Hower,  Karenann Cai.D on 10/26/2015 at 11:47 AM  Between 7am to 6pm - Pager - [phone]  After 6pm: House Pager: - [phone]  [facility]  Office  [phone]  CC: Primary care physician; No primary care provider on file.

## 2015-10-26 NOTE — Discharge Instructions (Signed)
Regular diet Same normal activity Follow-up with [facility] surgical in 2 weeks if needed Take oral antibiotics and pain medication if needed

## 2015-10-26 NOTE — Discharge Summary (Signed)
Physician Discharge Summary  Patient ID: Misty Craig MRN: [id number] DOB/AGE: 1991/03/11 25 y.o.  Admit date: 10/24/2015 Discharge date: 10/26/2015   Discharge Diagnoses:  Active Problems:   Abdominal pain   Convulsions ([place])   Procedures: None  Hospital Course: This a patient with a history of seizure disorders are presented with right upper quadrant pain but on exam she had right flank pain is well aware Suggested pyelonephritis versus acalculous cholecystitis. She is placed on IV antibiotics and promptly improved her cultures were negative however. A HIDA scan was ultimately obtained and failed to show any sign of cystic duct obstruction to suggest acute cholecystitis. With that in mind she is discharged stable condition on Keflex and Vicodin as needed to follow-up in our office as needed.  Consults: Internal medicine  Disposition: Final discharge disposition not confirmed     Medication List    TAKE these medications        cephALEXin 500 MG capsule  Commonly known as:  KEFLEX  Take 1 capsule (500 mg total) by mouth 4 (four) times daily.     Hydrocodone-Acetaminophen 5-300 MG Tabs  Commonly known as:  VICODIN  Take 1 tablet by mouth every 4 (four) hours as needed.     ibuprofen 200 MG tablet  Commonly known as:  ADVIL,MOTRIN  Take 400 mg by mouth every 6 (six) hours as needed for headache, mild pain or moderate pain.           Follow-up Information    Follow up with Phoebe Perch, MD In 2 weeks.   Specialty:  Surgery   Why:  If symptoms worsen   Contact information:   [address] [place] [address] [phone]       Florene Glen, MD, FACS

## 2015-10-26 NOTE — Progress Notes (Signed)
CC: Pyelo nephritis Subjective: This patient with pyelonephritis being treated with IV antibiotics his pain is mostly resolved and a HIDA scan was finally obtained after being ordered on Sunday night it showed no cystic duct obstruction to suggest a calculus cholecystitis.  Objective: Vital signs in last 24 hours: Temp:  [98.5 F (36.9 C)-98.8 F (37.1 C)] 98.5 F (36.9 C) (02/14 1209) Pulse Rate:  [72-86] 73 (02/14 1209) Resp:  [16] 16 (02/14 1209) BP: (94-106)/(60-67) 94/67 mmHg (02/14 1209) SpO2:  [98 %-100 %] 100 % (02/14 1209) Last BM Date: 10/23/15  Intake/Output from previous day: 02/13 0701 - 02/14 0700 In: 2520 [I.V.:2520] Out: 0  Intake/Output this shift:    Physical exam:  No scleral icterus soft nontender abdomen nontender calves  Lab Results: CBC   Recent Labs  10/25/15 0444 10/26/15 0557  WBC 9.2 12.1*  HGB 10.1* 10.1*  HCT 29.9* 29.8*  PLT 229 255   BMET  Recent Labs  10/25/15 0444 10/26/15 0557  NA 138 130*  K 3.3* 3.6  CL 109 101  CO2 25 23  GLUCOSE 99 108*  BUN 10 8  CREATININE 0.77 0.73  CALCIUM 7.8* 7.5*   PT/INR  Recent Labs  10/25/15 0444  LABPROT 16.4*  INR 1.31   ABG No results for input(s): PHART, HCO3 in the last 72 hours.  Invalid input(s): PCO2, PO2  Studies/Results: Nm Hepatobiliary Liver Func  10/26/2015  CLINICAL DATA:  Ten days of abdominal pain associated with nausea and vomiting EXAM: NUCLEAR MEDICINE HEPATOBILIARY IMAGING TECHNIQUE: Sequential images of the abdomen were obtained out to 60 minutes following intravenous administration of radiopharmaceutical. RADIOPHARMACEUTICALS:  4.97 mCi Tc-99m  Choletec IV COMPARISON:  Abdominal and pelvic CT scan dated October 24, 2015 which revealed gallbladder wall thickening new FINDINGS: There is adequate uptake of the radiopharmaceutical by the skeleton. Activity is visible in the common bile duct by approximately 10 minutes and within the gallbladder by approximately 15  minutes. Bowel activity is clearly evident by a 15-20 minutes. IMPRESSION: There is no evidence of cystic duct or common bile duct obstruction. The gallbladder fills normally. Electronically Signed   By: David  Jordan M.D.   On: 10/26/2015 16:21    Anti-infectives: Anti-infectives    Start     Dose/Rate Route Frequency Ordered Stop   10/26/15 0000  cephALEXin (KEFLEX) 500 MG capsule     500 mg Oral 4 times daily 10/26/15 1652     10/24/15 1700  piperacillin-tazobactam (ZOSYN) IVPB 3.375 g     3.375 g 12.5 mL/hr over 240 Minutes Intravenous 3 times per day 10/24/15 1500     02 /12/17 1045  piperacillin-tazobactam (ZOSYN) IVPB 3.375 g     3.375 g 12.5 mL/hr over 240 Minutes Intravenous  Once 10/24/15 1033 10/24/15 1353      Assessment/Plan:  Patient doing very well on IV antibiotics will switch to oral antibiotics and discharge as there is no sign of surgical indication for being in the hospital at this point treatment of her pyelonephritis will continue and she'll follow-up in our office on an as-needed basis  Florene Glen, MD, FACS  10/26/2015

## 2015-10-26 NOTE — Progress Notes (Signed)
10/26/2015 6:17 PM  BP 94/67 mmHg  Pulse 73  Temp(Src) 98.5 F (36.9 C) (Oral)  Resp 16  Wt 65.137 kg (143 lb 9.6 oz)  SpO2 100%  LMP 10/06/2015 (Exact Date) Patient discharged per MD orders. Interpreter at bedside interpreting. Discharge instructions reviewed with patient and patient verbalized understanding. IV's removed per policy. Prescriptions discussed and given to patient. Discharged ambulatory.  Almedia Balls, RN

## 2016-09-07 IMAGING — CT CT ABD-PELV W/O CM
1 of 2 series · 14 of 32 positions shown, 18 images · non-contrast
Comparison: None.

CLINICAL DATA: Upper abdominal pain radiating to bilateral flank.
Fever. Intermittent vomiting and headaches.

EXAM:
CT ABDOMEN AND PELVIS WITHOUT CONTRAST
TECHNIQUE: Multidetector CT imaging of the abdomen and pelvis was performed
following the standard protocol without IV contrast.

[Series 2: routine abd pel without · axial · non-contrast · 0.68mm/px · z∈[-472,-58]mm · 14 of 91 slices shown, 18 images]
[im 4/91  soft-tissue]
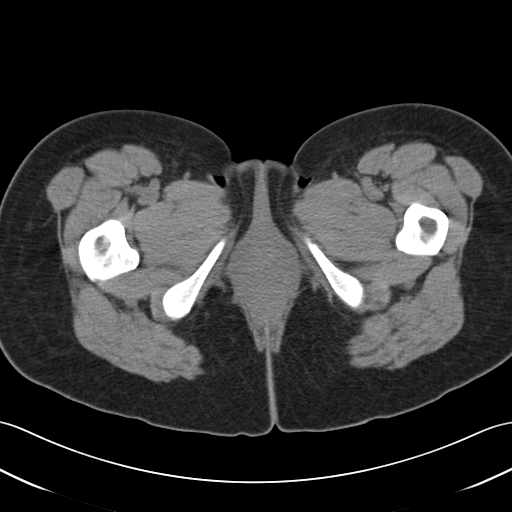
[im 4/91  bone]
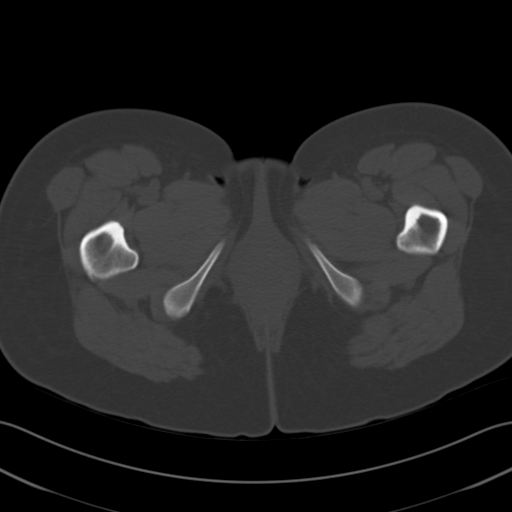
[im 12/91  soft-tissue]
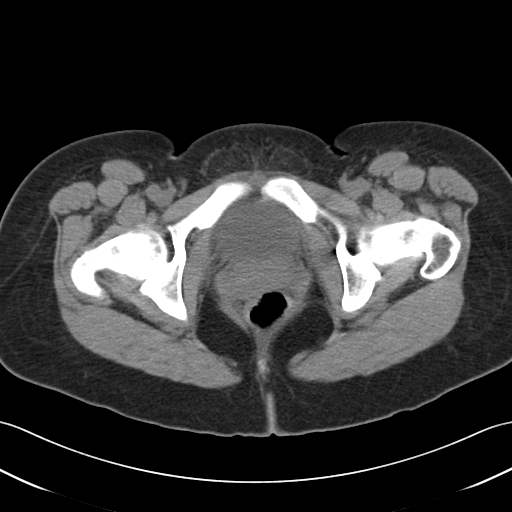
[im 19/91  soft-tissue]
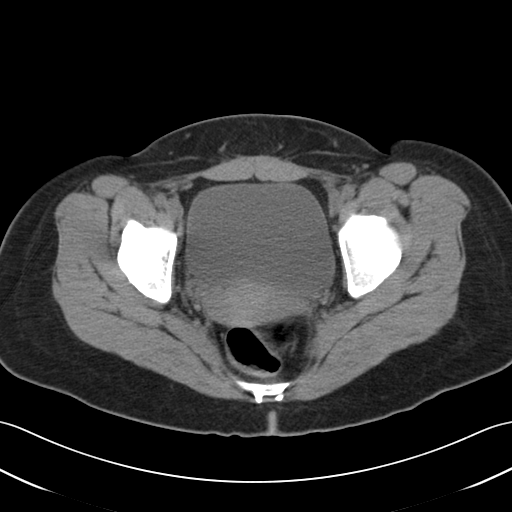
[im 27/91  soft-tissue]
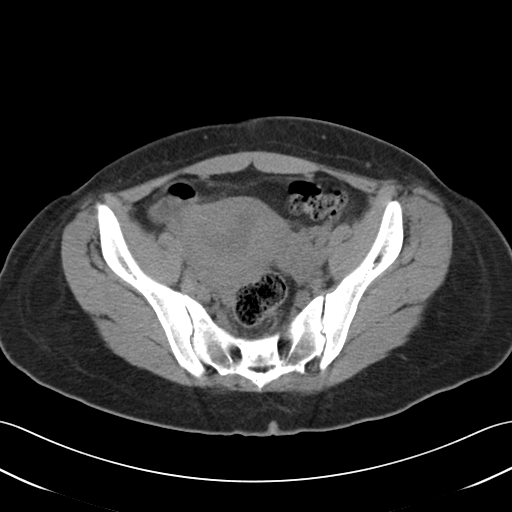
[im 34/91  soft-tissue]
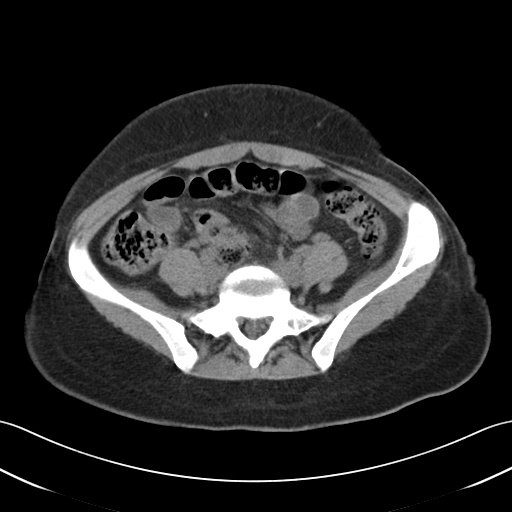
[im 42/91  soft-tissue]
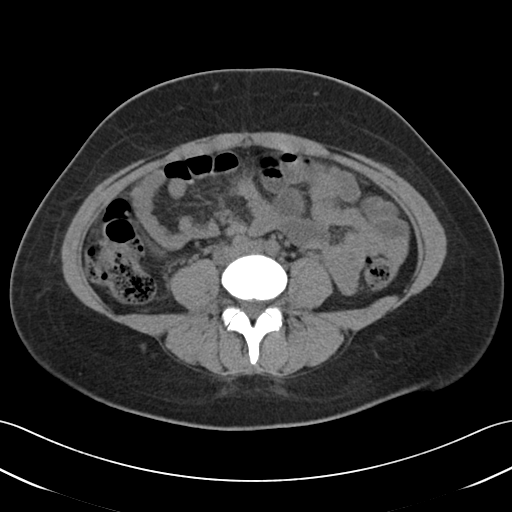
[im 49/91  soft-tissue]
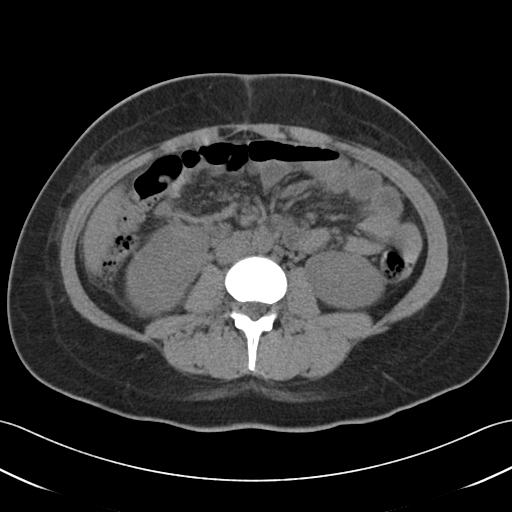
[im 57/91  soft-tissue]
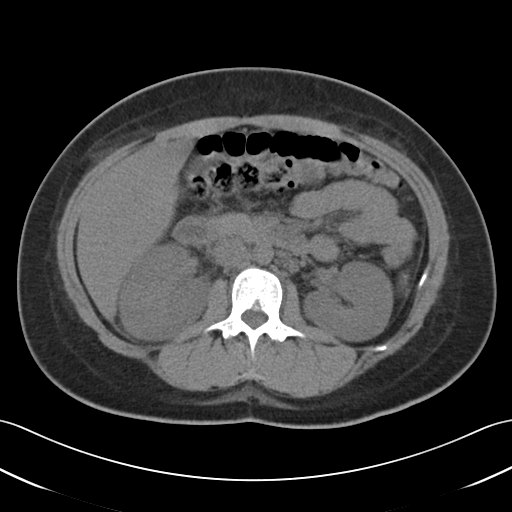
[im 64/91  soft-tissue]
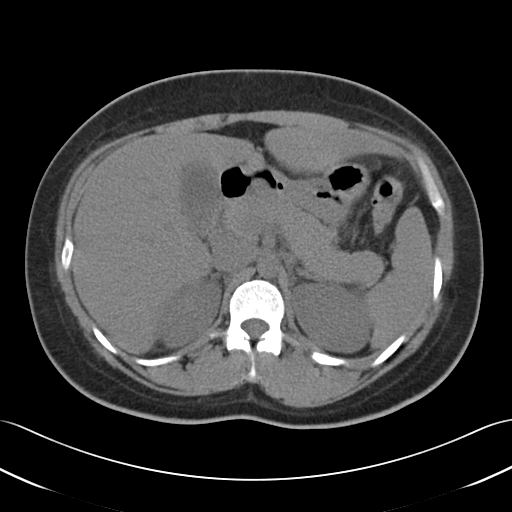
[im 64/91  bone]
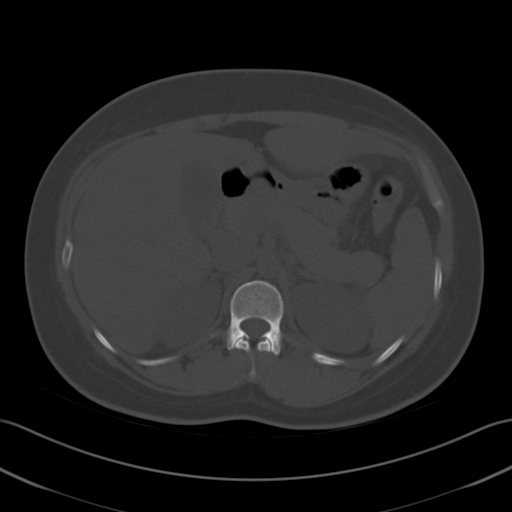
[im 72/91  soft-tissue]
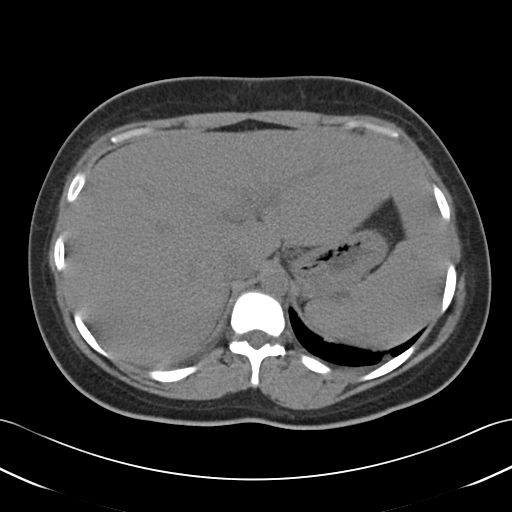
[im 76/91  lung]
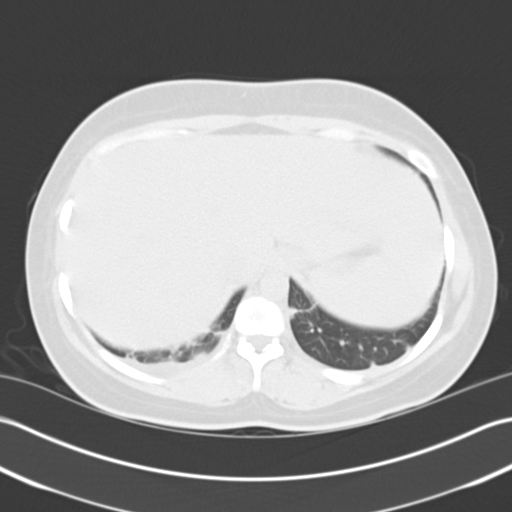
[im 79/91  soft-tissue]
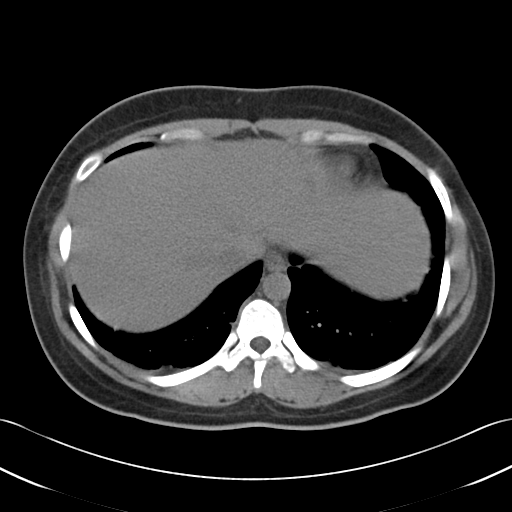
[im 79/91  lung]
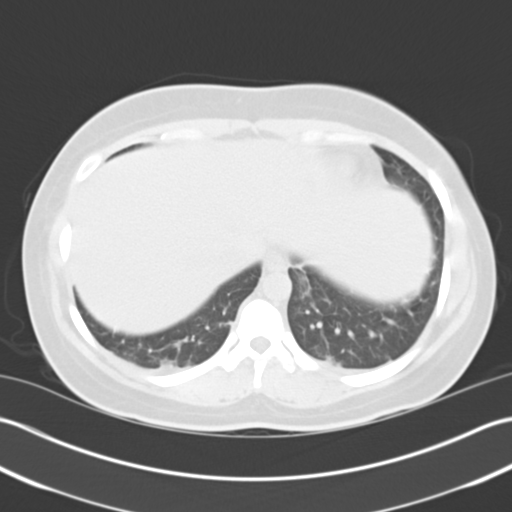
[im 83/91  lung]
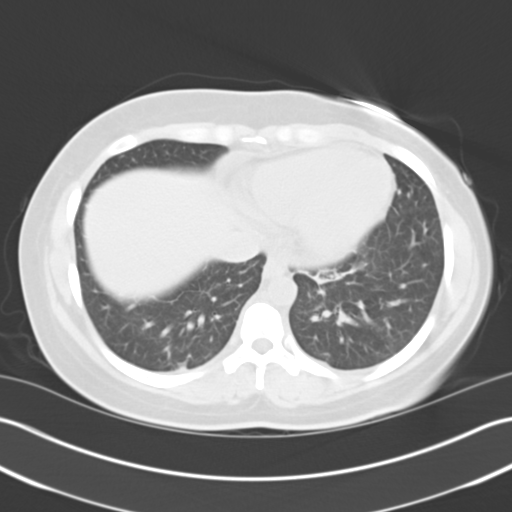
[im 87/91  soft-tissue]
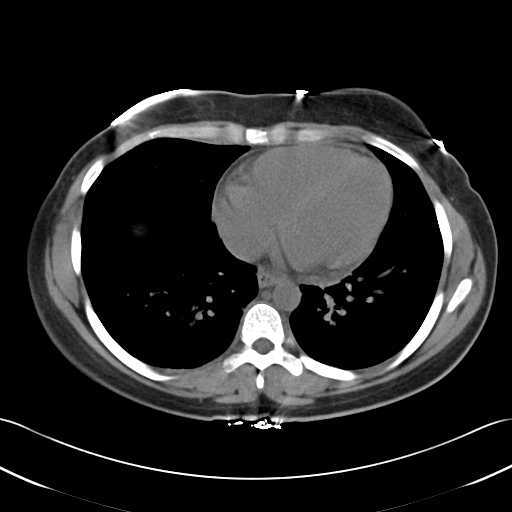
[im 87/91  lung]
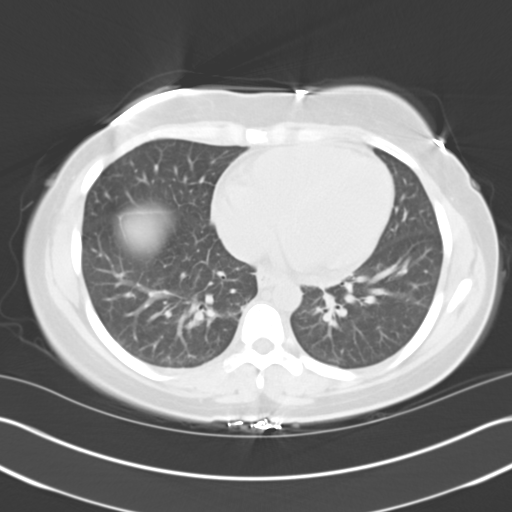

[14 of 32 positions shown; findings below may reference images not displayed]

FINDINGS: Lower chest:  Mild atelectasis at each lung base.

Hepatobiliary: Gallbladder walls appear thickened/amorphous,
suspicious for an acute cholecystitis. Liver is unremarkable.

Pancreas: No mass or inflammatory process identified on this
un-enhanced exam.

Spleen: Within normal limits in size.

Adrenals/Urinary Tract: Adrenal glands appear normal. Although
limited by the lack of IV contrast, there do appear to be edematous
changes within the right renal cortex and right perinephric edema
suggesting pyelonephritis. No renal stones bilaterally. No ureteral
or bladder calculi identified. Bladder is unremarkable.

Stomach/Bowel: Bowel is normal in caliber. No bowel wall thickening
or evidence of bowel wall inflammation. Visualized portion of the
appendix appears normal.

Vascular/Lymphatic: No pathologically enlarged lymph nodes. Numerous
small lymph nodes within the retroperitoneum are likely reactive in
nature. No evidence of abdominal aortic aneurysm.

Reproductive: No mass or other significant abnormality.

Other: None.

Musculoskeletal:  No suspicious bone lesions identified.
IMPRESSION: 1. Findings highly suspicious for an acute pyelonephritis of the
right kidney. Characterization limited by the lack of IV contrast,
but I suspect there is a fairly large amount of edema within the
right renal cortex and there is associated perinephric edema/fluid
stranding. This could be confirmed with a repeat abdomen CT with
contrast if needed. Recommend correlation with urinalysis.
2. Gallbladder wall thickening, also seen on the abdomen ultrasound
performed earlier same day. This is a nonspecific finding but may
indicate concomitant cholecystitis.

These results were called by telephone at the time of interpretation
on 10/24/2015 at [DATE] to Dr. NDANGUMUKIZA AVAB , who verbally
acknowledged these results.
